# Patient Record
Sex: Female | Born: 2000
Health system: Southern US, Community
[De-identification: ages and names within clinical notes are randomized; demographics above are authoritative.]

## PROBLEM LIST (undated history)

## (undated) DIAGNOSIS — J45990 Exercise induced bronchospasm: Secondary | ICD-10-CM

---
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
## 2019-06-25 ENCOUNTER — Ambulatory Visit (HOSPITAL_COMMUNITY)
Admission: EM | Admit: 2019-06-25 | Discharge: 2019-06-25 | Disposition: A | Discharge status: Home / Self Care | Payer: BC Managed Care – PPO | Attending: Emergency Medicine | Admitting: Emergency Medicine

## 2019-06-25 ENCOUNTER — Encounter (HOSPITAL_COMMUNITY): Payer: Self-pay

## 2019-06-25 ENCOUNTER — Other Ambulatory Visit: Payer: Self-pay

## 2019-06-25 DIAGNOSIS — J019 Acute sinusitis, unspecified: Secondary | ICD-10-CM

## 2019-06-25 MED ORDER — DOXYCYCLINE HYCLATE 100 MG PO CAPS: 100.0000 mg | ORAL_CAPSULE | Freq: Two times a day (BID) | ORAL | 0 refills | Status: DC

## 2019-06-25 NOTE — ED Provider Notes (Signed)
[facility]    CSN: [id number] Arrival date & time: 06/25/19  1106     History   Chief Complaint Chief Complaint  Patient presents with  . Nasal Congestion    HPI Philis Nettlenna C Spindel is a 18 y.o. female.   Patient presents with sinus congestion and pressure x3 months.  She has tried over-the-counter Claritin without relief.  Denies fever, chills, sore throat, ear pain, rhinorrhea, cough, shortness of breath, vomiting, diarrhea.  LMP: current.    The history is provided by the patient.    Past Medical History:  Diagnosis Date  . Exercise-induced asthma     There are no active problems to display for this patient.   History reviewed. No pertinent surgical history.  OB History   No obstetric history on file.      Home Medications    Prior to Admission medications   Medication Sig Start Date End Date Taking? Authorizing Provider  albuterol (PROVENTIL HFA;VENTOLIN HFA) 108 (90 Base) MCG/ACT inhaler Inhale 2 puffs into the lungs every hour as needed for wheezing or shortness of breath.     [provider]  cyclobenzaprine (FLEXERIL) 5 MG tablet Take 1 tablet (5 mg total) by mouth 3 (three) times daily as needed for muscle spasms. 10/28/16   Charlynne PanderYao, David Hsienta, MD  doxycycline (VIBRAMYCIN) 100 MG capsule Take 1 capsule (100 mg total) by mouth 2 (two) times daily. 06/25/19   Mickie Bailate, Melainie Krinsky H, NP  ibuprofen (ADVIL,MOTRIN) 600 MG tablet Take 1 tablet (600 mg total) by mouth every 6 (six) hours as needed. 10/28/16   Charlynne PanderYao, David Hsienta, MD  Melatonin 1 MG TABS Take 1 mg by mouth at bedtime. Only takes on school nights    [provider]  TRI-LO-SPRINTEC 0.18/0.215/0.25 MG-25 MCG tab Take 1 tablet by mouth daily. 10/03/16   [provider]    Family History History reviewed. No pertinent family history.  Social History Social History   Tobacco Use  . Smoking status: Never Smoker  . Smokeless tobacco: Never Used  Substance Use Topics  .  Alcohol use: No  . Drug use: No     Allergies   Augmentin [amoxicillin-pot clavulanate] and Omnicef [cefdinir]   Review of Systems Review of Systems  Constitutional: Negative for chills and fever.  HENT: Positive for sinus pressure and sinus pain. Negative for ear pain, sore throat and trouble swallowing.   Eyes: Negative for pain and visual disturbance.  Respiratory: Negative for cough and shortness of breath.   Cardiovascular: Negative for chest pain and palpitations.  Gastrointestinal: Negative for abdominal pain and vomiting.  Genitourinary: Negative for dysuria and hematuria.  Musculoskeletal: Negative for arthralgias and back pain.  Skin: Negative for color change and rash.  Neurological: Negative for seizures and syncope.  All other systems reviewed and are negative.    Physical Exam Triage Vital Signs ED Triage Vitals  Enc Vitals Group     BP 06/25/19 1131 137/73     Pulse Rate 06/25/19 1131 (!) 105     Resp 06/25/19 1131 16     Temp 06/25/19 1131 99.6 F (37.6 C)     Temp Source 06/25/19 1131 Oral     SpO2 06/25/19 1131 100 %     Weight --      Height --      Head Circumference --      Peak Flow --      Pain Score 06/25/19 1132 0     Pain Loc --  Pain Edu? --      Excl. in [place]? --    No data found.  Updated Vital Signs BP 137/73 (BP Location: Right Arm)   Pulse (!) 105   Temp 99.6 F (37.6 C) (Oral)   Resp 16   SpO2 100%   Visual Acuity Right Eye Distance:   Left Eye Distance:   Bilateral Distance:    Right Eye Near:   Left Eye Near:    Bilateral Near:     Physical Exam Vitals signs and nursing note reviewed.  Constitutional:      General: She is not in acute distress.    Appearance: She is well-developed.  HENT:     Head: Normocephalic and atraumatic.     Right Ear: Tympanic membrane normal.     Left Ear: Tympanic membrane normal.     Nose: Congestion present.     Mouth/Throat:     Mouth: Mucous membranes are moist.     Pharynx:  Oropharynx is clear.  Eyes:     Conjunctiva/sclera: Conjunctivae normal.  Neck:     Musculoskeletal: Neck supple.  Cardiovascular:     Rate and Rhythm: Normal rate and regular rhythm.     Heart sounds: No murmur.  Pulmonary:     Effort: Pulmonary effort is normal. No respiratory distress.     Breath sounds: Normal breath sounds.  Abdominal:     Palpations: Abdomen is soft.     Tenderness: There is no abdominal tenderness. There is no guarding or rebound.  Skin:    General: Skin is warm and dry.  Neurological:     Mental Status: She is alert.      UC Treatments / Results  Labs (all labs ordered are listed, but only abnormal results are displayed) Labs Reviewed - No data to display  EKG   Radiology No results found.  Procedures Procedures (including critical care time)  Medications Ordered in UC Medications - No data to display  Initial Impression / Assessment and Plan / UC Course  I have reviewed the triage vital signs and the nursing notes.  Pertinent labs & imaging results that were available during my care of the patient were reviewed by me and considered in my medical decision making (see chart for details).   Sinusitis.  Treating today with doxycycline.  Discussed that she can take over-the-counter Tylenol or Motrin as needed for discomfort.  Discussed with patient that she should follow-up here or with her primary care provider if she develops fever, chills, sore throat, earache, cough, shortness of breath, vomiting, diarrhea, other symptoms.     Final Clinical Impressions(s) / UC Diagnoses   Final diagnoses:  Acute non-recurrent sinusitis, unspecified location     Discharge Instructions     You have a sinus infection.  Take the prescribed antibiotic as directed.    Here or follow-up with your primary care provider if you develop fever, chills, sore throat, earache, cough, shortness of breath, vomiting, diarrhea, other symptoms.        ED  Prescriptions    Medication Sig Dispense Auth. Provider   doxycycline (VIBRAMYCIN) 100 MG capsule Take 1 capsule (100 mg total) by mouth 2 (two) times daily. 20 capsule Sharion Balloon, NP     Controlled Substance Prescriptions [place] Controlled Substance Registry consulted? Not Applicable   Sharion Balloon, NP 06/25/19 1244

## 2019-06-25 NOTE — Discharge Instructions (Signed)
You have a sinus infection.  Take the prescribed antibiotic as directed.    Here or follow-up with your primary care provider if you develop fever, chills, sore throat, earache, cough, shortness of breath, vomiting, diarrhea, other symptoms.

## 2019-06-25 NOTE — ED Triage Notes (Signed)
Pt C/O nasal congestion, symptoms has been going on for almost 3 months. Pt denies any other symptoms.

## 2019-07-05 DIAGNOSIS — J019 Acute sinusitis, unspecified: Secondary | ICD-10-CM | POA: Diagnosis not present

## 2019-07-27 DIAGNOSIS — Z6821 Body mass index (BMI) 21.0-21.9, adult: Secondary | ICD-10-CM | POA: Diagnosis not present

## 2019-07-27 DIAGNOSIS — M25572 Pain in left ankle and joints of left foot: Secondary | ICD-10-CM | POA: Diagnosis not present

## 2019-07-27 DIAGNOSIS — S93402A Sprain of unspecified ligament of left ankle, initial encounter: Secondary | ICD-10-CM | POA: Diagnosis not present

## 2019-08-09 ENCOUNTER — Other Ambulatory Visit: Payer: Self-pay | Admitting: Emergency Medicine

## 2019-08-09 DIAGNOSIS — R6889 Other general symptoms and signs: Secondary | ICD-10-CM | POA: Diagnosis not present

## 2019-08-09 DIAGNOSIS — Z20822 Contact with and (suspected) exposure to covid-19: Secondary | ICD-10-CM

## 2019-08-10 LAB — NOVEL CORONAVIRUS, NAA: SARS-CoV-2, NAA: NOT DETECTED

## 2019-08-11 ENCOUNTER — Other Ambulatory Visit: Payer: Self-pay

## 2019-08-11 DIAGNOSIS — R6889 Other general symptoms and signs: Secondary | ICD-10-CM | POA: Diagnosis not present

## 2019-08-11 DIAGNOSIS — Z20822 Contact with and (suspected) exposure to covid-19: Secondary | ICD-10-CM

## 2019-08-12 LAB — NOVEL CORONAVIRUS, NAA: SARS-CoV-2, NAA: DETECTED — AB

## 2019-09-17 DIAGNOSIS — R070 Pain in throat: Secondary | ICD-10-CM | POA: Diagnosis not present

## 2019-09-17 DIAGNOSIS — R519 Headache, unspecified: Secondary | ICD-10-CM | POA: Diagnosis not present

## 2019-09-17 DIAGNOSIS — Z6821 Body mass index (BMI) 21.0-21.9, adult: Secondary | ICD-10-CM | POA: Diagnosis not present

## 2019-09-17 DIAGNOSIS — R5383 Other fatigue: Secondary | ICD-10-CM | POA: Diagnosis not present

## 2019-09-17 DIAGNOSIS — Z1159 Encounter for screening for other viral diseases: Secondary | ICD-10-CM | POA: Diagnosis not present

## 2019-11-09 DIAGNOSIS — Z01419 Encounter for gynecological examination (general) (routine) without abnormal findings: Secondary | ICD-10-CM | POA: Diagnosis not present

## 2019-11-09 DIAGNOSIS — Z6820 Body mass index (BMI) 20.0-20.9, adult: Secondary | ICD-10-CM | POA: Diagnosis not present

## 2019-12-20 ENCOUNTER — Ambulatory Visit: Payer: 59 | Attending: Internal Medicine

## 2019-12-20 DIAGNOSIS — Z20822 Contact with and (suspected) exposure to covid-19: Secondary | ICD-10-CM

## 2019-12-22 LAB — NOVEL CORONAVIRUS, NAA: SARS-CoV-2, NAA: NOT DETECTED

## 2020-08-15 IMAGING — US US ABDOMEN LIMITED
1 series · 14 of 15 positions shown · non-contrast
Comparison: October 28, 2016

ADDENDUM:
Comment: Appendix not appreciable. If there remains concern for
appendiceal pathology, CT of the abdomen and pelvis, ideally with
oral and intravenous contrast, may well be advisable.
CLINICAL DATA: Upper abdominal pain

EXAM:
ULTRASOUND ABDOMEN LIMITED RIGHT UPPER QUADRANT

[Series 1: us abdomen limited · 0.11mm/px · 14 of 15 slices shown]
[im 1/15]
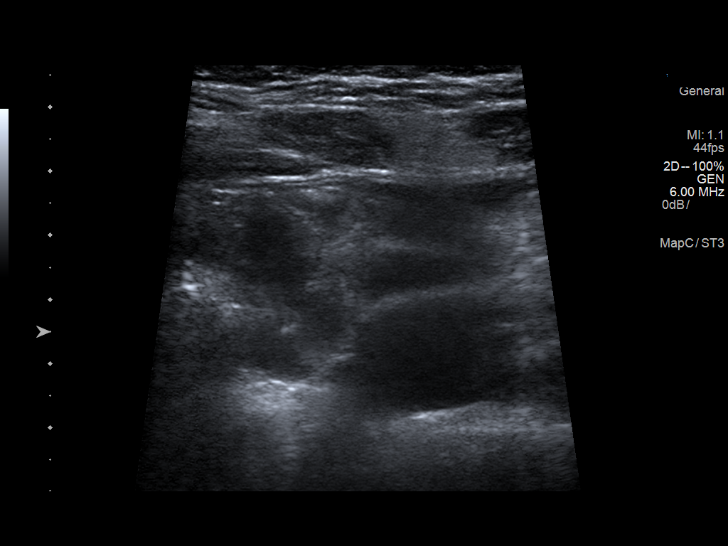
[im 2/15]
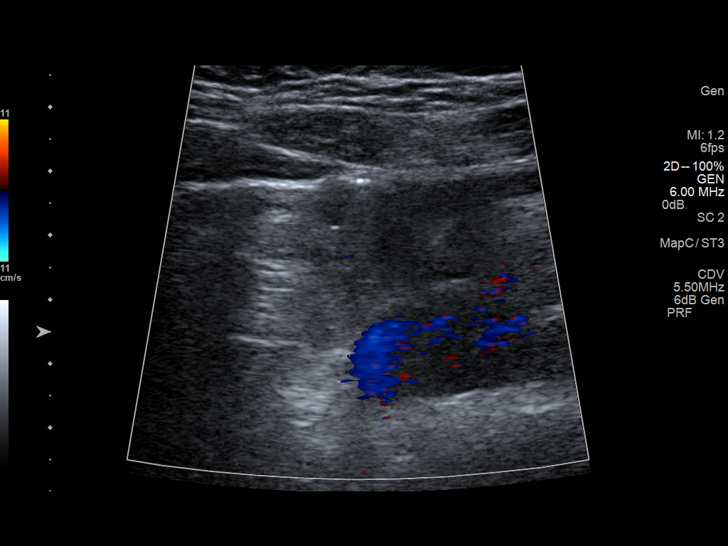
[im 3/15]
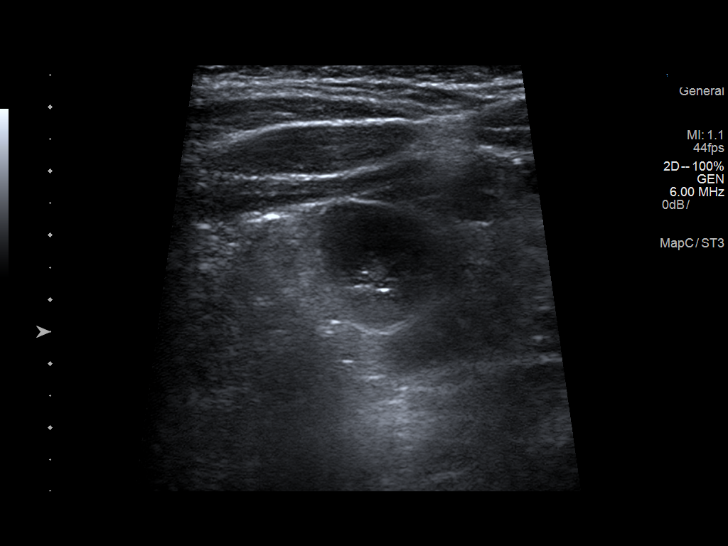
[im 4/15]
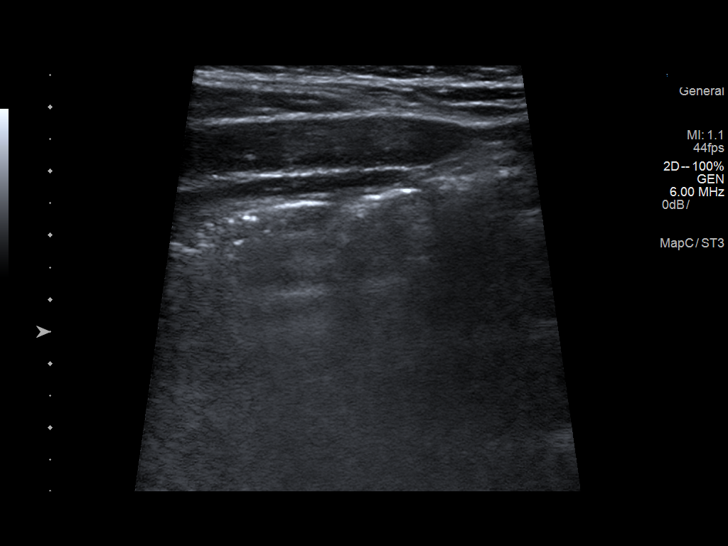
[im 5/15]
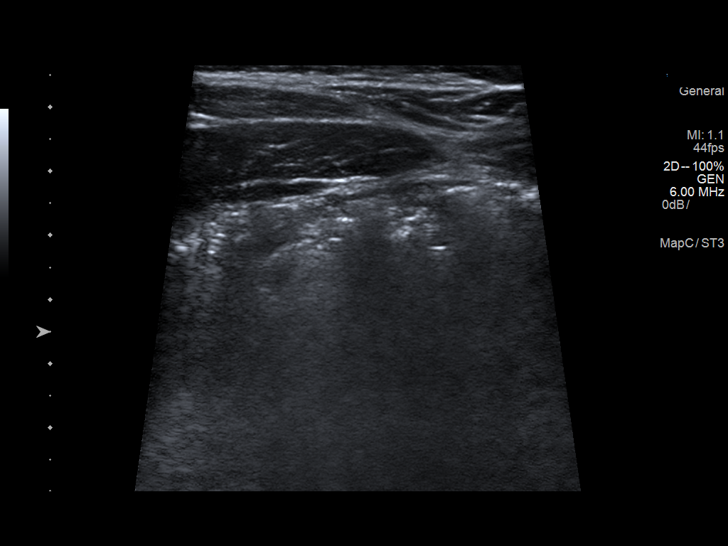
[im 6/15]
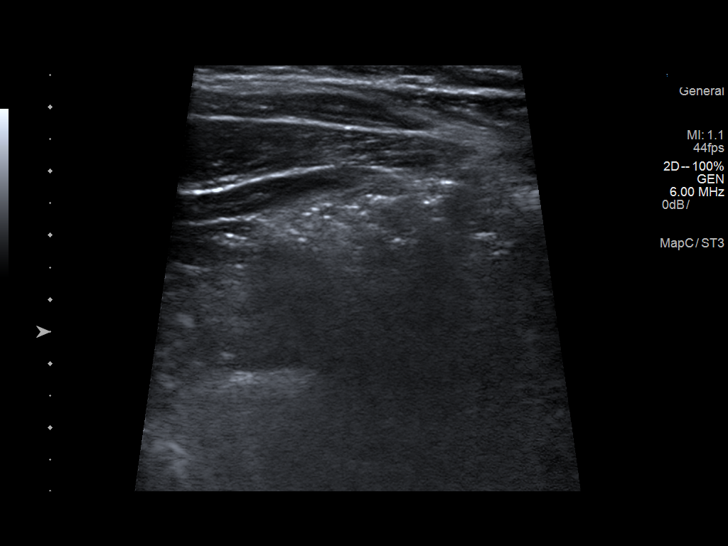
[im 7/15]
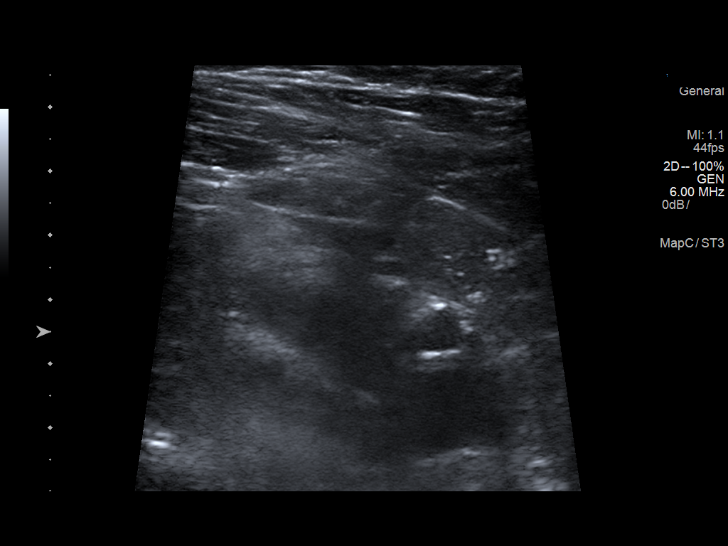
[im 9/15]
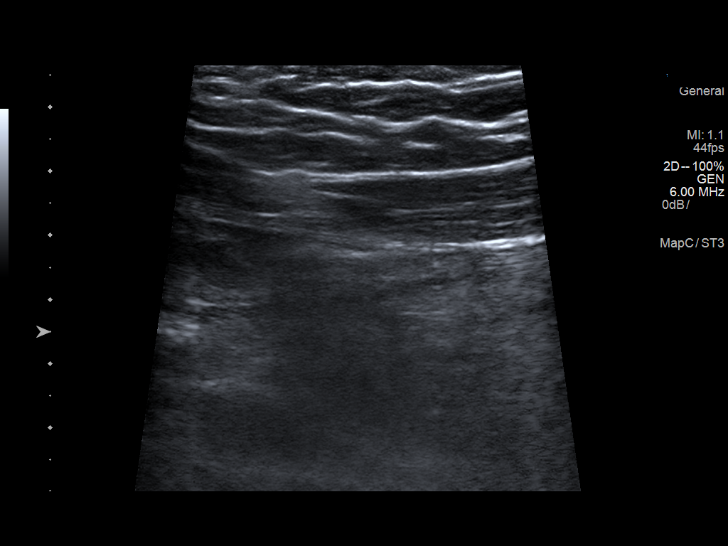
[im 10/15]
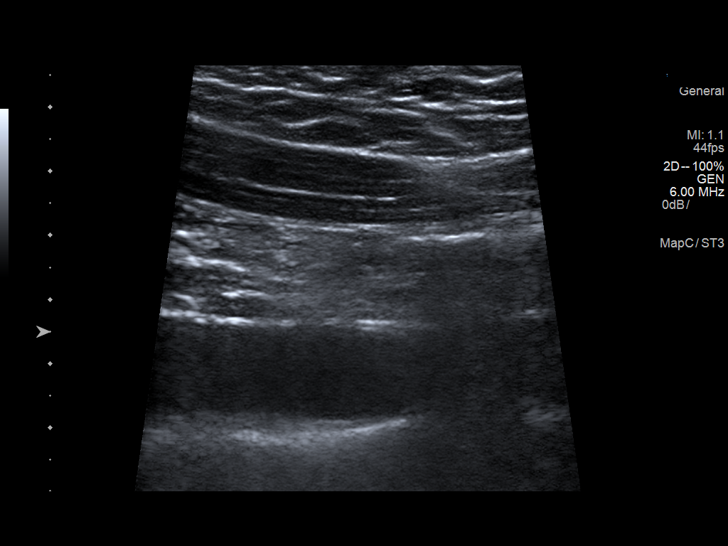
[im 11/15]
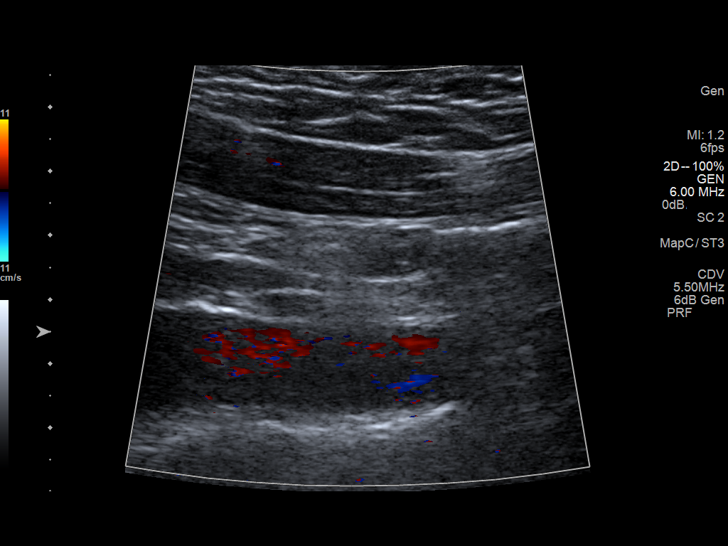
[im 12/15]
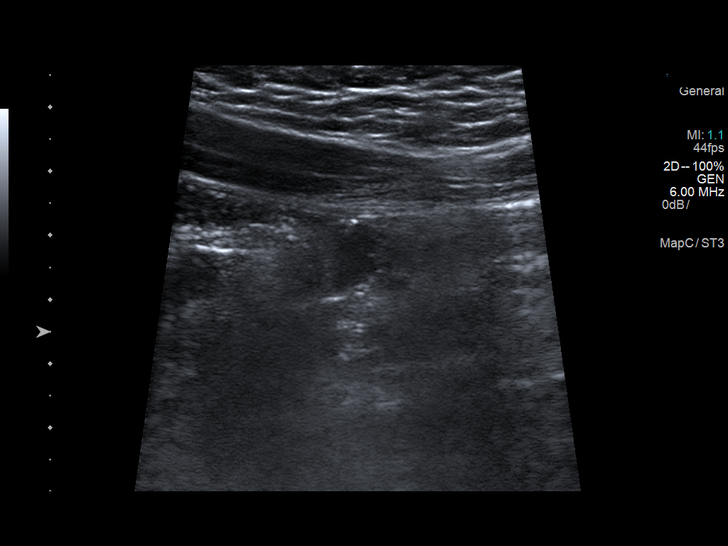
[im 13/15]
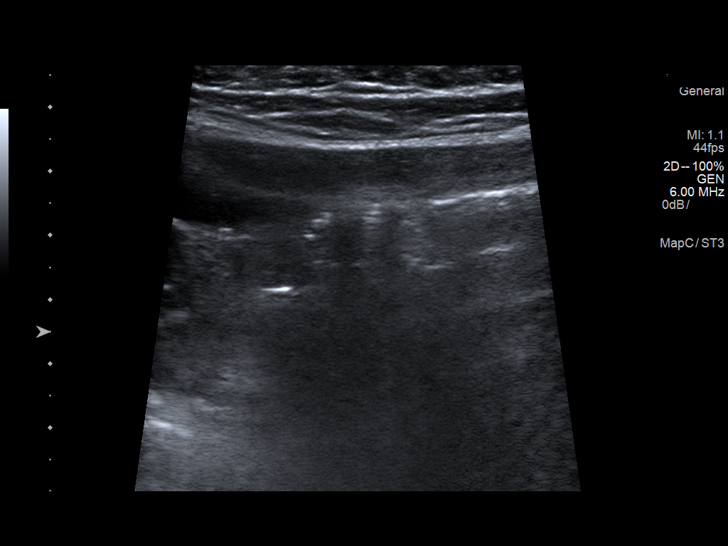
[im 14/15]
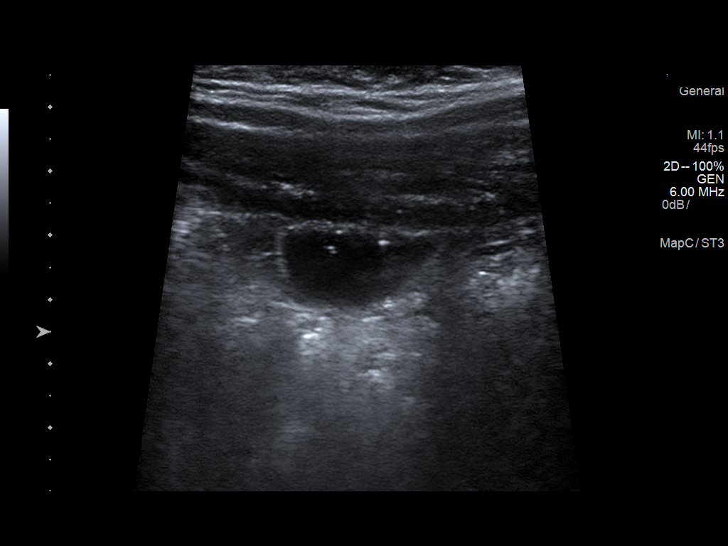
[im 15/15]
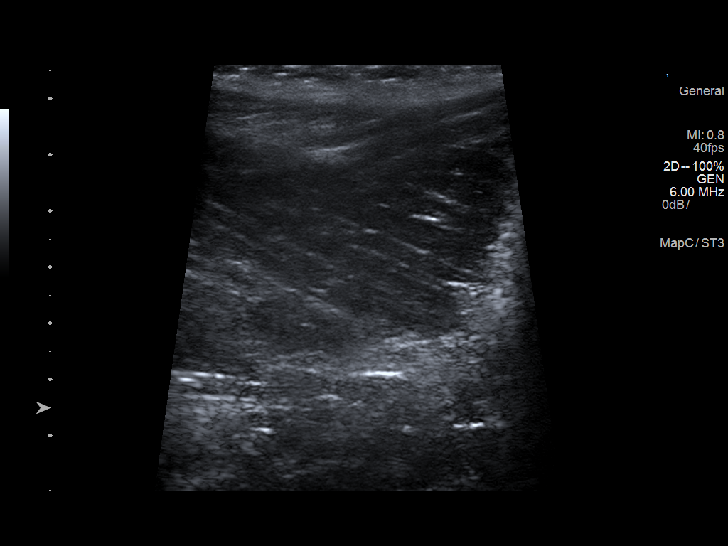

[14 of 15 positions shown; findings below may reference images not displayed]

FINDINGS: Gallbladder:

No gallstones or wall thickening visualized. There is no
pericholecystic fluid. No sonographic Murphy sign noted by
sonographer.

Common bile duct:

Diameter: 3 mm. No intrahepatic or extrahepatic biliary duct
dilatation.

Liver:

No focal lesion identified. Within normal limits in parenchymal
echogenicity. Portal vein is patent on color Doppler imaging with
normal direction of blood flow towards the liver.

There is trace free fluid in the right upper quadrant.
IMPRESSION: Trace free fluid right upper quadrant. Study otherwise unremarkable.

## 2022-11-26 ENCOUNTER — Other Ambulatory Visit: Payer: Self-pay | Admitting: Cardiology

## 2022-11-26 ENCOUNTER — Other Ambulatory Visit (INDEPENDENT_AMBULATORY_CARE_PROVIDER_SITE_OTHER): Payer: 59

## 2022-11-26 DIAGNOSIS — R55 Syncope and collapse: Secondary | ICD-10-CM

## 2022-11-26 DIAGNOSIS — R002 Palpitations: Secondary | ICD-10-CM

## 2022-11-26 DIAGNOSIS — R42 Dizziness and giddiness: Secondary | ICD-10-CM

## 2022-11-26 NOTE — Progress Notes (Unsigned)
Enrolled for [employer] to mail a [employer] Live Telemetry monitor to patients address on file.   Dr. Jodelle Red.

## 2022-11-29 DIAGNOSIS — R002 Palpitations: Secondary | ICD-10-CM | POA: Diagnosis not present

## 2022-11-29 DIAGNOSIS — R42 Dizziness and giddiness: Secondary | ICD-10-CM

## 2022-11-29 DIAGNOSIS — R55 Syncope and collapse: Secondary | ICD-10-CM | POA: Diagnosis not present

## 2022-12-11 NOTE — Progress Notes (Signed)
Cardiology Office Note:    Date:  12/14/2022   ID:  Emma Baldwin, DOB July 22, 2001, MRN [id number]  PCP:  Billey Gosling, MD  Cardiologist:  Jodelle Red, MD  Referring MD: Billey Gosling, MD   CC:  New patient evaluation for palpitations   History of Present Illness:    Emma Baldwin is a 22 y.o. female with a hx of exercise-induced asthma, who is seen as a new consult at the request of Billey Gosling, MD for the evaluation and management of palpitations.  On 11/18/2022 she presented to the [facility] ED complaining of lightheadedness and shortness of breath with tachycardia and palpitations. Onset was sudden in the setting of watching TV; she felt like she would pass out. Upon arrival to the ED she had positive orthostatics. Her symptoms were felt to be consistent with near-syncope orthostatic hypotension on assessment. She sometimes struggled with adequate hydration and not eating well. After IV fluids, her blood pressure normalized and she was feeling better.  She was then seen 11/25/2022 in the [facility] ED and underwent emergent limited cardiac ultrasound which showed no significant evidence of disease. A 7 day Zio monitor was ordered.  Today, she is accompanied by her mother. She has sent in her Zio monitor about 3 days ago. She confirms having episodes while wearing the monitor, and states she did note the triggered events.  Tachycardia/palpitations: -Initial onset: Tuesday, 11/25/22 when she went to the ED. Onset was very sudden, she felt as though her "heart dropped into her stomach" with rapid heart beats. This seemed to resolve so she lied down. Then she began feeling like her head and heart were "shutting down". At the time she could barely retain consciousness, and her roommate said she appeared jaundiced. She may have been a little dehydrated, but she knows this should not be the main etiology of her symptoms. Of note, after her initial episode that day, she  continued to have frequent cycling of her episodes later that day. -Frequency/Duration: 1-2 episodes every few days for the past month. The episodes still occur very suddenly and randomly. The feelings of "weirdness" in her head lasts much longer; her other symptoms are typically brief.  -Associated symptoms: Blurry vision, confusion, dry mouth, unable to hold her head up, bilateral temporal pressure, right and left sided chest pain possibly related to pounding heart beats, shortness of breath, fatigue, coldness of hands and feet, anxiety.  During her initial episode she also noticed tingling sensations of her arms and legs. In the past month, her shortness of breath is mostly constant, she is unable to catch her breath as though she is running a race all the time. She also complains of always feeling a little nauseous, but denies any cough, no sputum production. -Aggravating/alleviating factors: Noted to have a severe case of strep throat about 1 month prior to her initial onset. She was on Tamiflu for 1 week, then given a more appropriate antibiotic. -Syncope/near syncope: Has had several near-syncopal episodes. No complete loss of consciousness. -Prior cardiac history: She has a known heart murmur. -Prior ECG: On 11/18/2022 showed sinus tachycardia, possible LAE, nonspecific ST abnormality. -Prior workup: ED workup in December.  -Prior treatment: none -Possible medication interactions: -Caffeine: Drinks coffee, and 2 diet cokes daily.  -OTC supplements: none -Comorbidities: Exercise-induced asthma. In the past year she has only needed to use an inhaler twice. -Exercise level: Typically she is athletic and physically active; she would run/walk 6 miles a day. In  the past month she has been unable to be active due to her new symptoms. She is mostly limited by shortness of breath. They are also concerned that her resting heart rate seems be higher than it used to be. One morning she woke up at 115 bpm  per home pulse ox. Previously her normal heart rate was in the 50s. -Labs: TSH, kidney function/electrolytes, CBC reviewed. -Cardiac ROS: chest pain, shortness of breath, no PND, no orthopnea, no LE edema.  Admittedly she does not have the best diet while in school. Lately she has been trying to focus on increasing intake of green vegetables, protein, and water.   Past Medical History:  Diagnosis Date   Exercise-induced asthma     History reviewed. No pertinent surgical history.  Current Medications: Current Outpatient Medications on File Prior to Visit  Medication Sig   albuterol (PROVENTIL HFA;VENTOLIN HFA) 108 (90 Base) MCG/ACT inhaler Inhale 2 puffs into the lungs every hour as needed for wheezing or shortness of breath.    ibuprofen (ADVIL,MOTRIN) 600 MG tablet Take 1 tablet (600 mg total) by mouth every 6 (six) hours as needed.   TRI-LO-SPRINTEC 0.18/0.215/0.25 MG-25 MCG tab Take 1 tablet by mouth daily.   No current facility-administered medications on file prior to visit.     Allergies:   Augmentin [amoxicillin-pot clavulanate] and Omnicef [cefdinir]   Social History   Tobacco Use   Smoking status: Never   Smokeless tobacco: Never  Substance Use Topics   Alcohol use: No   Drug use: No    Family History: family history includes Cancer in her father.  ROS:   Please see the history of present illness.  Additional pertinent ROS: Constitutional: Negative for fever, night sweats, unintentional weight loss. Positive for chills, fatigue.  HENT: Negative for ear pain and hearing loss.   Eyes: Negative for loss of vision and eye pain.  Respiratory: Negative for cough, sputum, wheezing. Positive for shortness of breath. Cardiovascular: See HPI. Gastrointestinal: Negative for abdominal pain, melena, and hematochezia. Positive for nausea. Genitourinary: Negative for dysuria and hematuria.  Musculoskeletal: Negative for falls and myalgias.  Skin: Negative for itching and  rash.  Neurological: Negative for loss of consciousness. Positive for generalized weakness. Endo/Heme/Allergies: Does not bruise/bleed easily.     EKGs/Labs/Other Studies Reviewed:    The following studies were reviewed today:  Monitor 11/2022: Final results pending.  Limited Cardiac Ultrasound  11/25/2022  [facility]): Impression:  No sonographic evidence of significant cardiac dysfunction, No sonographic  evidence of significant pericardial effusion, Normal RV, and No  sonographic evidence of volume depletion   Cardiopulmonary Exercise Test  01/02/2016: Conclusion: Exercise testing with gas exchange demonstrates  excellent functional capacity when compared to matched sedentary  norms. There was no significant circulatory limitation, however  note tachycardia at rest and into recovery. At peak exercise,  patient appears limited due to exercise-induced bronchospasm  (seen immediately post-exercise). This resolved quickly with  passive recovery/rest. Ventilatory threshold slightly lower than  expected for age and level of physical activity. Pre-exercise EIB  prevention recommended.   Exercise testing suggestive of exercise-induced bronchospasm  (versus ventilatory fatigue which can be seen in young,  highly-motivated female athletes). Would consider trial of  singulair daily and albuterol 30 mins prior to exercise.    EKG:  EKG is personally reviewed.   12/12/2022:  NSR at 92 bpm  Recent Labs: No results found for requested labs within last 365 days.   Recent Lipid Panel No results  found for: "CHOL", "TRIG", "HDL", "CHOLHDL", "VLDL", "LDLCALC", "LDLDIRECT"  Physical Exam:    VS:  BP 110/72 (BP Location: Right Arm, Patient Position: Sitting, Cuff Size: Normal)   Pulse 92   Ht 5\' 7"  (1.702 m)   Wt 129 lb 14.4 oz (58.9 kg)   BMI 20.35 kg/m     Wt Readings from Last 3 Encounters:  12/12/22 129 lb 14.4 oz (58.9 kg)  09/17/18 132 lb 7.9 oz (60.1 kg) (66 %, Z= 0.41)*   08/22/18 135 lb 6.4 oz (61.4 kg) (70 %, Z= 0.53)*   * Growth percentiles are based on CDC (Girls, 2-20 Years) data.    GEN: Well nourished, well developed in no acute distress HEENT: Normal, moist mucous membranes NECK: No JVD CARDIAC: regular rhythm, normal S1 and S2, no rubs or gallops. No murmur. VASCULAR: Radial and DP pulses 2+ bilaterally. No carotid bruits RESPIRATORY:  Clear to auscultation without rales, wheezing or rhonchi  ABDOMEN: Soft, non-tender, non-distended MUSCULOSKELETAL:  Ambulates independently SKIN: Warm and dry, no edema NEUROLOGIC:  Alert and oriented x 3. No focal neuro deficits noted. PSYCHIATRIC:  Normal affect    ASSESSMENT:    1. Palpitations   2. Pre-syncope   3. Lightheadedness   4. Shortness of breath    PLAN:    Palpitations, presyncope, lightheadedness, shortness of breath -recent onset. Had strep treated (initially told it was the flu and took tamilfu), otherwise no recent illnesses -we discussed potential etiologies at length today -most helpful information will be rhythm on the monitor. Discussed difference between sinus tach (triggers/management) vs arrhythmia.  -discussed options for further evaluation. If monitor does not have significant abnormalities, they are ok with monitoring symptoms. If abnormalities found, will get echo and discuss management options -discussed hydration, exercise recommendations -reviewed red flag warning signs that need immediate medical attention  Cardiac risk counseling and prevention recommendations: -recommend heart healthy/Mediterranean diet, with whole grains, fruits, vegetable, fish, lean meats, nuts, and olive oil. Limit salt. -recommend moderate walking, 3-5 times/week for 30-50 minutes each session. Aim for at least 150 minutes.week. Goal should be pace of 3 miles/hours, or walking 1.5 miles in 30 minutes -recommend avoidance of tobacco products. Avoid excess alcohol. -ASCVD risk score: The ASCVD Risk  score (Arnett DK, et al., 2019) failed to calculate for the following reasons:   The 2019 ASCVD risk score is only valid for ages 19 to 73    Plan for follow up: TBD based on results of monitor, or sooner as needed.  Buford Dresser, MD, PhD, [facility]    Medication Adjustments/Labs and Tests Ordered: Current medicines are reviewed at length with the patient today.  Concerns regarding medicines are outlined above.   Orders Placed This Encounter  Procedures   EKG 12-Lead   No orders of the defined types were placed in this encounter.  Patient Instructions  Medication Instructions:  Your physician recommends that you continue on your current medications as directed. Please refer to the Current Medication list given to you today.   Labwork: NONE  Testing/Procedures: NONE  Follow-Up: WILL BE DETERMINED AFTER MONITOR RESULTS REVIEWED      I,Mathew Stumpf,acting as a Education administrator for [employer], MD.,have documented all relevant documentation on the behalf of Buford Dresser, MD,as directed by  Buford Dresser, MD while in the presence of Buford Dresser, MD.  I, Buford Dresser, MD, have reviewed all documentation for this visit. The documentation on 12/14/22 for the exam, diagnosis, procedures, and orders are  all accurate and complete.   Signed, Buford Dresser, MD PhD 12/14/2022 9:53 PM    [place]

## 2022-12-12 ENCOUNTER — Encounter (HOSPITAL_BASED_OUTPATIENT_CLINIC_OR_DEPARTMENT_OTHER): Payer: Self-pay | Admitting: Cardiology

## 2022-12-12 ENCOUNTER — Ambulatory Visit (INDEPENDENT_AMBULATORY_CARE_PROVIDER_SITE_OTHER): Payer: 59 | Admitting: Cardiology

## 2022-12-12 VITALS — BP 110/72 | HR 92 | Ht 67.0 in | Wt 129.9 lb

## 2022-12-12 DIAGNOSIS — R42 Dizziness and giddiness: Secondary | ICD-10-CM

## 2022-12-12 DIAGNOSIS — R55 Syncope and collapse: Secondary | ICD-10-CM

## 2022-12-12 DIAGNOSIS — R002 Palpitations: Secondary | ICD-10-CM

## 2022-12-12 DIAGNOSIS — R0602 Shortness of breath: Secondary | ICD-10-CM | POA: Diagnosis not present

## 2022-12-12 NOTE — Patient Instructions (Signed)
Medication Instructions:  Your physician recommends that you continue on your current medications as directed. Please refer to the Current Medication list given to you today.   Labwork: NONE  Testing/Procedures: NONE  Follow-Up: WILL BE DETERMINED AFTER MONITOR RESULTS REVIEWED

## 2022-12-14 ENCOUNTER — Encounter (HOSPITAL_BASED_OUTPATIENT_CLINIC_OR_DEPARTMENT_OTHER): Payer: Self-pay | Admitting: Cardiology

## 2022-12-16 ENCOUNTER — Telehealth (HOSPITAL_BASED_OUTPATIENT_CLINIC_OR_DEPARTMENT_OTHER): Payer: Self-pay | Admitting: Cardiology

## 2022-12-16 NOTE — Telephone Encounter (Signed)
Spoke with mother, ok per DPR  Patient didn't have a very good night last night and they were just hoping to get results of monitor results  Will forward to Dr Harrell Gave for review

## 2022-12-16 NOTE — Telephone Encounter (Signed)
Calling in bout patient zio patch report. Please advie

## 2022-12-19 ENCOUNTER — Encounter: Payer: Self-pay | Admitting: Cardiology

## 2022-12-19 NOTE — Telephone Encounter (Signed)
Error

## 2022-12-19 NOTE — Telephone Encounter (Signed)
Previously routed to Dr. Harrell Gave for review. Printed for review

## 2022-12-19 NOTE — Telephone Encounter (Signed)
Pt mother called in again requesting to speak with RN about monitor results

## 2022-12-22 ENCOUNTER — Telehealth: Payer: Self-pay | Admitting: Cardiology

## 2022-12-22 NOTE — Telephone Encounter (Signed)
Patient's mother is calling for her daughter's monitor results.

## 2022-12-23 NOTE — Telephone Encounter (Signed)
Patient called back to follow-up on test results. 

## 2022-12-23 NOTE — Telephone Encounter (Signed)
Preliminary review of monitor report with no significant arrhythmias. Formal review by Dr. Oval Linsey to follow.   Emma Dubonnet, NP

## 2022-12-31 NOTE — Telephone Encounter (Signed)
Advised mother, ok per DPR  Per mother patient seems to have less palpitations and is doing better

## 2022-12-31 NOTE — Telephone Encounter (Signed)
Preliminary monitor report reviewed. Predominantly normal sinus rhythm. No significant arrhythmia. Triggered episodes were associated with normal sinus rhythm or sinus tachycardia. No changes recommended at this time. Prevent palpitations through recommendations below. If Dr. Harrell Gave has any recommended changes when she reviews we will let her know. However, if she does not hear from [place] it means Dr. Harrell Gave did not recommend any changes.   To prevent palpitations: Make sure you are adequately hydrated.  Avoid and/or limit caffeine containing beverages like soda or tea. Exercise regularly.  Manage stress well. Some over the counter medications can cause palpitations such as Benadryl, AdvilPM, TylenolPM. Regular Advil or Tylenol do not cause palpitations.    Loel Dubonnet, NP
